# Patient Record
Sex: Female | Born: 1990 | Race: Black or African American | Hispanic: No | Marital: Single | Smoking: Never smoker
Health system: Southern US, Community
[De-identification: ages and names within clinical notes are randomized; demographics above are authoritative.]

## PROBLEM LIST (undated history)

## (undated) DIAGNOSIS — D649 Anemia, unspecified: Secondary | ICD-10-CM

## (undated) DIAGNOSIS — J45909 Unspecified asthma, uncomplicated: Secondary | ICD-10-CM

## (undated) DIAGNOSIS — G43909 Migraine, unspecified, not intractable, without status migrainosus: Secondary | ICD-10-CM

## (undated) HISTORY — DX: Migraine, unspecified, not intractable, without status migrainosus: G43.909

## (undated) HISTORY — PX: CHOLECYSTECTOMY: SHX55

## (undated) HISTORY — PX: HERNIA REPAIR: SHX51

---
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
## 2010-04-06 ENCOUNTER — Emergency Department (HOSPITAL_COMMUNITY): Admission: EM | Admit: 2010-04-06 | Discharge: 2009-07-13 | Payer: Self-pay | Admitting: Emergency Medicine

## 2010-09-12 NOTE — H&P (Signed)
Miranda Norton, Miranda Norton               ACCOUNT NO.:  [id number]   MEDICAL RECORD NO.:  [id number]          PATIENT TYPE:  AMB   LOCATION:  DAY                           FACILITY:  [facility]   PHYSICIAN:  Dalia Heading, M.D.  DATE OF BIRTH:  1991/02/08   DATE OF ADMISSION:  DATE OF DISCHARGE:  LH                              HISTORY & PHYSICAL   CHIEF COMPLAINT:  Acute cholecystitis.   HISTORY OF PRESENT ILLNESS:  Patient is a 20 year old black female, was  referred for evaluation and treatment biliary cholic secondary to acute  cholecystitis.  She has been having intermittent right upper quadrant  abdominal pain with radiation to the flank, nausea and indigestion for  the past few weeks.  It is made worse with fatty foods.  No fever,  chills, or jaundice have been noted.   PAST MEDICAL HISTORY:  Unremarkable.   PAST SURGICAL HISTORY:  Umbilical herniorrhaphy.   CURRENT MEDICATIONS:  Vitamin D.   ALLERGIES:  NO KNOWN DRUG ALLERGIES.   REVIEW OF SYSTEMS:  Noncontributory.   PHYSICAL EXAMINATION:  Patient is a well-developed, well-nourished black  female in no acute distress.  HEENT:  Reveals no scleral icterus.  LUNGS:  Clear to auscultation with equal breath sounds bilaterally.  HEART:  Reveals a regular rate and rhythm without S3, S4 or murmurs.  ABDOMEN:  Soft and nondistended.  She is tender in the right upper  quadrant to palpation.  No hepatosplenomegaly, masses or hernias  identified.   CT scan of the abdomen reveals a thickened gallbladder wall consistent  with acute cholecystitis.  Her common bile duct is within normal limits.   IMPRESSION:  Acute cholecystectomy.   PLAN:  The patient is scheduled for laparoscopic cholecystectomy on  April 21, 2008.  The risks and benefits of the procedure including  bleeding, infection, hepatobiliary injury, the possibility of an open  procedure were fully explained to the patient's mother, who gave  informed consent for the patient  as the patient is a minor.      Dalia Heading, M.D.  Electronically Signed     MAJ/MEDQ  D:  04/20/2008  T:  04/20/2008  Job:  [id number]   cc:   Short Stay at [facility]

## 2010-09-12 NOTE — Op Note (Signed)
[name], Miranda Norton               ACCOUNT NO.:  [id number]   MEDICAL RECORD NO.:  [id number]          PATIENT TYPE:  AMB   LOCATION:  DAY                           FACILITY:  [facility]   PHYSICIAN:  Dalia Heading, M.D.  DATE OF BIRTH:  January 16, 1991   DATE OF PROCEDURE:  04/21/2008  DATE OF DISCHARGE:                               OPERATIVE REPORT   PREOPERATIVE DIAGNOSIS:  Acute cholecystitis.   POSTOPERATIVE DIAGNOSIS:  Acute cholecystitis.   PROCEDURE:  Laparoscopic cholecystectomy.   SURGEON:  Dalia Heading, MD   ANESTHESIA:  General endotracheal.   INDICATIONS:  The patient is a 20 year old black female who presents  with acute cholecystitis.  This was diagnosed by CT scan of the abdomen.  The risks and benefits of the procedure including bleeding, infection,  hepatobiliary injury, the possibly of an open procedure were fully  explained to the patient's mother, who gave informed consent for the  patient as the patient is a minor.   PROCEDURE NOTE:  The patient was placed in the supine position.  After  induction of general endotracheal anesthesia, the abdomen was prepped  and draped using the usual sterile technique with DuraPrep.  Surgical  site confirmation was performed.   A supraumbilical incision was made down to the fascia.  A Veress needle  was introduced into the abdominal cavity and confirmation of placement  was done using the saline drop test.  The abdomen was then insufflated  to 16 mmHg pressure.  An 11-mm trocar was introduced into the abdominal  cavity under direct visualization without difficulty.  The patient was  placed in reverse Trendelenburg position.  An additional 11-mm trocar  was placed in the epigastric region, 5-mm trocars were placed in the  right upper quadrant right flank regions.  Liver was inspected and noted  to be within normal limits.  The gallbladder was retracted superior and  laterally.  The dissection was begun around the infundibulum  of the  gallbladder.  The cystic duct was first identified.  The juncture to the  infundibulum were identified.  Endoclips were placed proximally and  distally on the cystic duct and the cystic duct was divided.  This  likewise done on the cystic artery.  The gallbladder was then freed away  from the gallbladder fossa using Bovie electrocautery.  The gallbladder  was delivered through the epigastric trocar site using EndoCatch bag.  The gallbladder fossa was inspected.  No abnormal bleeding or bile  leakage was noted.  Surgicel was placed in the gallbladder fossa.  All  fluid and air was then evacuated from the abdominal cavity prior to  removal of the trocars.   All wounds were irrigated with normal saline.  All wounds were checked  with 0.5% Sensorcaine.  The supraumbilical fascia was reapproximated  using an 0 Vicryl interrupted suture.  All skin incisions were closed  using staples.  Betadine ointment and dry sterile dressings were  applied.   All tape and needle counts were correct at the end of the procedure.  The patient was extubated in the operating room and went  back to  recovery room awake in stable condition.   COMPLICATIONS:  None.   SPECIMEN:  Gallbladder.   ESTIMATED BLOOD LOSS:  Minimal.      Dalia Heading, M.D.  Electronically Signed     MAJ/MEDQ  D:  04/21/2008  T:  04/21/2008  Job:  [id number]   cc:   Francoise Schaumann. Milford Cage DO, FAAP  Fax: [phone]

## 2010-10-22 ENCOUNTER — Emergency Department (HOSPITAL_COMMUNITY)
Admission: EM | Admit: 2010-10-22 | Discharge: 2010-10-22 | Disposition: A | Discharge status: Home / Self Care | Payer: BC Managed Care – PPO | Attending: Emergency Medicine | Admitting: Emergency Medicine

## 2010-10-22 DIAGNOSIS — Z9109 Other allergy status, other than to drugs and biological substances: Secondary | ICD-10-CM | POA: Insufficient documentation

## 2010-10-22 DIAGNOSIS — F1010 Alcohol abuse, uncomplicated: Secondary | ICD-10-CM | POA: Insufficient documentation

## 2010-10-22 LAB — ETHANOL: Alcohol, Ethyl (B): 237 mg/dL — ABNORMAL HIGH (ref 0–11)

## 2011-02-01 LAB — BASIC METABOLIC PANEL
BUN: 11 mg/dL (ref 6–23)
Calcium: 9.4 mg/dL (ref 8.4–10.5)
Creatinine, Ser: 0.74 mg/dL (ref 0.4–1.2)
Glucose, Bld: 112 mg/dL — ABNORMAL HIGH (ref 70–99)

## 2011-02-01 LAB — URINALYSIS, ROUTINE W REFLEX MICROSCOPIC
Bilirubin Urine: NEGATIVE
Glucose, UA: NEGATIVE mg/dL
Hgb urine dipstick: NEGATIVE
Ketones, ur: 15 mg/dL — AB
Leukocytes, UA: NEGATIVE
Nitrite: NEGATIVE
Protein, ur: 100 mg/dL — AB
Specific Gravity, Urine: >1.03 — ABNORMAL HIGH (ref 1.005–1.030)
Urobilinogen, UA: 0.2 mg/dL (ref 0.0–1.0)
pH: 5.5 (ref 5.0–8.0)

## 2011-02-01 LAB — CBC
Hemoglobin: 9.6 g/dL — ABNORMAL LOW (ref 12.0–16.0)
MCHC: 32 g/dL (ref 31.0–37.0)
MCV: 79.4 fL (ref 78.0–98.0)
RBC: 3.77 MIL/uL — ABNORMAL LOW (ref 3.80–5.70)
RDW: 14.4 % (ref 11.4–15.5)

## 2011-02-01 LAB — HEPATIC FUNCTION PANEL
Bilirubin, Direct: <0.1 mg/dL (ref 0.0–0.3)
Total Protein: 7.2 g/dL (ref 6.0–8.3)

## 2011-02-01 LAB — DIFFERENTIAL
Basophils Absolute: 0 10*3/uL (ref 0.0–0.1)
Basophils Relative: 0 % (ref 0–1)
Eosinophils Absolute: 0 10*3/uL (ref 0.0–1.2)
Monocytes Absolute: 0.5 10*3/uL (ref 0.2–1.2)
Monocytes Relative: 8 % (ref 3–11)

## 2011-02-01 LAB — PREGNANCY, URINE: Preg Test, Ur: NEGATIVE

## 2011-02-01 LAB — URINE MICROSCOPIC-ADD ON

## 2011-02-15 LAB — URINALYSIS, ROUTINE W REFLEX MICROSCOPIC
Bilirubin Urine: NEGATIVE
Glucose, UA: NEGATIVE
Ketones, ur: NEGATIVE
Leukocytes, UA: NEGATIVE
Nitrite: NEGATIVE
Specific Gravity, Urine: >1.03 — ABNORMAL HIGH
Urobilinogen, UA: 0.2
pH: 6

## 2011-02-15 LAB — PREGNANCY, URINE: Preg Test, Ur: NEGATIVE

## 2011-02-15 LAB — URINE MICROSCOPIC-ADD ON

## 2011-11-13 ENCOUNTER — Encounter (HOSPITAL_COMMUNITY): Payer: Self-pay

## 2011-11-13 ENCOUNTER — Emergency Department (HOSPITAL_COMMUNITY): Payer: BC Managed Care – PPO

## 2011-11-13 ENCOUNTER — Emergency Department (HOSPITAL_COMMUNITY)
Admission: EM | Admit: 2011-11-13 | Discharge: 2011-11-13 | Disposition: A | Discharge status: Home / Self Care | Payer: BC Managed Care – PPO | Attending: Emergency Medicine | Admitting: Emergency Medicine

## 2011-11-13 DIAGNOSIS — R000 Tachycardia, unspecified: Secondary | ICD-10-CM | POA: Insufficient documentation

## 2011-11-13 DIAGNOSIS — J45909 Unspecified asthma, uncomplicated: Secondary | ICD-10-CM | POA: Insufficient documentation

## 2011-11-13 DIAGNOSIS — J40 Bronchitis, not specified as acute or chronic: Secondary | ICD-10-CM

## 2011-11-13 DIAGNOSIS — Z886 Allergy status to analgesic agent status: Secondary | ICD-10-CM | POA: Insufficient documentation

## 2011-11-13 HISTORY — DX: Unspecified asthma, uncomplicated: J45.909

## 2011-11-13 MED ORDER — GUAIFENESIN-CODEINE 100-10 MG/5ML PO SYRP: ORAL_SOLUTION | ORAL | Status: DC

## 2011-11-13 MED ORDER — AZITHROMYCIN 250 MG PO TABS: ORAL_TABLET | ORAL | Status: DC

## 2011-11-13 NOTE — ED Notes (Signed)
Pt POCT pregnancy test came out negative RN is aware.

## 2011-11-13 NOTE — ED Notes (Signed)
Pt c/o cough, congestion, runny nose and chest pain with coughing for 3 days. Pt alert and oriented x 3. Skin warm and dry. Color pink. No acute distress.

## 2011-11-13 NOTE — ED Provider Notes (Signed)
History     CSN: [id number]  Arrival date & time 11/13/11  1507   First MD Initiated Contact with Patient 11/13/11 1603      Chief Complaint  Patient presents with  . Cough    (Consider location/radiation/quality/duration/timing/severity/associated sxs/prior treatment) Patient is a 21 y.o. female presenting with cough. The history is provided by the patient and medical records. No language interpreter was used.  Cough This is a new problem. The current episode started 2 days ago. The problem occurs hourly. The problem has not changed since onset.The cough is productive of sputum. Maximum temperature: Had subjective feeling of fever last night. Associated symptoms include chest pain. Pertinent negatives include no wheezing. She has tried nothing for the symptoms. She is not a smoker. Her past medical history is significant for asthma.    Past Medical History  Diagnosis Date  . Asthma     Past Surgical History  Procedure Date  . Hernia repair   . Cholecystectomy     No family history on file.  History  Substance Use Topics  . Smoking status: Never Smoker   . Smokeless tobacco: Not on file  . Alcohol Use: No    OB History    Grav Para Term Preterm Abortions TAB SAB Ect Mult Living                  Review of Systems  Constitutional: Positive for fever.  HENT: Negative.   Eyes: Negative.   Respiratory: Positive for cough. Negative for wheezing.   Cardiovascular: Positive for chest pain.  Gastrointestinal: Negative.   Genitourinary: Negative.   Musculoskeletal: Negative.   Skin: Negative.   Neurological: Negative.   Psychiatric/Behavioral: Negative.     Allergies  Nsaids  Home Medications   Current Outpatient Rx  Name Route Sig Dispense Refill  . ALBUTEROL SULFATE HFA 108 (90 BASE) MCG/ACT IN AERS Inhalation Inhale 2 puffs into the lungs every 6 (six) hours as needed.    . ETONOGESTREL 68 MG [place] IMPL Subcutaneous Inject 1 each into the skin once.    Marland Kitchen  LORATADINE 10 MG PO TABS Oral Take 10 mg by mouth daily.      BP 115/78  Pulse 110  Temp 98.6 F (37 C) (Oral)  Resp 18  Ht 5\' 6"  (1.676 m)  Wt 165 lb (74.844 kg)  BMI 26.63 kg/m2  SpO2 100%  LMP 10/30/2011  Physical Exam  Nursing note and vitals reviewed. Constitutional: She is oriented to person, place, and time. She appears well-developed and well-nourished. No distress.  HENT:  Head: Normocephalic and atraumatic.  Right Ear: External ear normal.  Left Ear: External ear normal.  Mouth/Throat: Oropharynx is clear and moist.  Eyes: Conjunctivae and EOM are normal. Pupils are equal, round, and reactive to light.  Neck: Normal range of motion. Neck supple.  Cardiovascular: Normal rate and normal heart sounds.        Tachycardia of 110.  Pulmonary/Chest: Effort normal and breath sounds normal.  Abdominal: Soft. Bowel sounds are normal.  Musculoskeletal: Normal range of motion.  Neurological: She is alert and oriented to person, place, and time.       No sensory or motor deficit.  Skin: Skin is warm and dry.  Psychiatric: She has a normal mood and affect. Her behavior is normal.    ED Course  Procedures (including critical care time)   Labs Reviewed  POCT PREGNANCY, URINE   Dg Chest 2 View  11/13/2011  *RADIOLOGY REPORT*  Clinical Data: .  Cough.Chest pain.  CHEST - 2 VIEW  Comparison: None.  Findings: No infiltrate, congestive heart failure or pneumothorax. Heart size within normal limits.  IMPRESSION: No acute abnormality.  Original Report Authenticated By: Fuller Canada, M.D.   Rx for bronchitis with Z-pak, Robitussin AC.   1. Bronchitis          Carleene Cooper III, MD 11/13/11 1622

## 2011-11-13 NOTE — ED Notes (Signed)
Pt reports for past 2 days has had a cough and chest hurts to breathe.  Chest pain gets worse when coughs.  Reports cough has been productive at times but hasn't looked at what she was coughing up.  Unsure if has had fever.

## 2011-11-15 ENCOUNTER — Emergency Department (HOSPITAL_COMMUNITY)
Admission: EM | Admit: 2011-11-15 | Discharge: 2011-11-15 | Disposition: A | Discharge status: Home / Self Care | Payer: BC Managed Care – PPO | Attending: Emergency Medicine | Admitting: Emergency Medicine

## 2011-11-15 ENCOUNTER — Encounter (HOSPITAL_COMMUNITY): Payer: Self-pay | Admitting: *Deleted

## 2011-11-15 DIAGNOSIS — J45909 Unspecified asthma, uncomplicated: Secondary | ICD-10-CM | POA: Insufficient documentation

## 2011-11-15 DIAGNOSIS — R21 Rash and other nonspecific skin eruption: Secondary | ICD-10-CM | POA: Insufficient documentation

## 2011-11-15 DIAGNOSIS — T7840XA Allergy, unspecified, initial encounter: Secondary | ICD-10-CM

## 2011-11-15 DIAGNOSIS — Z9089 Acquired absence of other organs: Secondary | ICD-10-CM | POA: Insufficient documentation

## 2011-11-15 DIAGNOSIS — T40605A Adverse effect of unspecified narcotics, initial encounter: Secondary | ICD-10-CM | POA: Insufficient documentation

## 2011-11-15 MED ORDER — PREDNISONE 20 MG PO TABS
40.0000 mg | ORAL_TABLET | Freq: Once | ORAL | Status: AC
  Administered 2011-11-15: 40 mg via ORAL
  Filled 2011-11-15: qty 2

## 2011-11-15 MED ORDER — PREDNISONE 20 MG PO TABS: ORAL_TABLET | ORAL | Status: DC

## 2011-11-15 MED ORDER — DIPHENHYDRAMINE HCL 25 MG PO CAPS
25.0000 mg | ORAL_CAPSULE | Freq: Once | ORAL | Status: AC
  Administered 2011-11-15: 25 mg via ORAL
  Filled 2011-11-15: qty 1

## 2011-11-15 NOTE — ED Provider Notes (Signed)
History     CSN: [id number]  Arrival date & time 11/15/11  1913   First MD Initiated Contact with Patient 11/15/11 2003      Chief Complaint  Patient presents with  . Rash    (Consider location/radiation/quality/duration/timing/severity/associated sxs/prior treatment) HPI Comments: Patient c/o red, mildly itching rash to her face, neck and trunk that began after taking codeine cough syrup for the first time.  States she is also taking a z-pack for bronchitis, but states she has taken zithromax before without complications.  She denies swelling, difficulty breathing or swallowing.    Patient is a 21 y.o. female presenting with rash. The history is provided by the patient.  Rash  This is a new problem. The current episode started yesterday. The problem has not changed since onset.The problem is associated with new medications. There has been no fever. The rash is present on the face, neck and trunk. The patient is experiencing no pain. The pain has been constant since onset. Associated symptoms include itching. Pertinent negatives include no blisters, no pain and no weeping. She has tried antihistamines for the symptoms. The treatment provided mild relief. Risk factors include new medications.    Past Medical History  Diagnosis Date  . Asthma     Past Surgical History  Procedure Date  . Hernia repair   . Cholecystectomy     History reviewed. No pertinent family history.  History  Substance Use Topics  . Smoking status: Never Smoker   . Smokeless tobacco: Not on file  . Alcohol Use: No    OB History    Grav Para Term Preterm Abortions TAB SAB Ect Mult Living                  Review of Systems  Constitutional: Negative for fever, activity change and appetite change.  HENT: Positive for congestion. Negative for sore throat, facial swelling, trouble swallowing, neck pain and neck stiffness.   Respiratory: Positive for cough. Negative for shortness of breath, wheezing and  stridor.   Cardiovascular: Negative for chest pain.  Gastrointestinal: Negative for nausea, vomiting and abdominal distention.  Musculoskeletal: Negative for back pain, joint swelling, arthralgias and gait problem.  Skin: Positive for color change, itching and rash. Negative for wound.  Neurological: Negative for dizziness, weakness, numbness and headaches.  Hematological: Negative for adenopathy.  All other systems reviewed and are negative.    Allergies  Nsaids  Home Medications   Current Outpatient Rx  Name Route Sig Dispense Refill  . ALBUTEROL SULFATE HFA 108 (90 BASE) MCG/ACT IN AERS Inhalation Inhale 2 puffs into the lungs every 6 (six) hours as needed.    . AZITHROMYCIN 250 MG PO TABS  Take 2 tablets today, then 1 tablet per day for the next 4 days. 6 tablet 0  . ETONOGESTREL 68 MG [place] IMPL Subcutaneous Inject 1 each into the skin once.    . GUAIFENESIN-CODEINE 100-10 MG/5ML PO SYRP  Take 2 teaspoons every 4 hours if needed for cough. 120 mL 0  . LORATADINE 10 MG PO TABS Oral Take 10 mg by mouth daily.      BP 120/64  Pulse 91  Temp 98.8 F (37.1 C) (Oral)  Resp 20  Ht 5\' 6"  (1.676 m)  Wt 165 lb (74.844 kg)  BMI 26.63 kg/m2  SpO2 100%  LMP 10/30/2011  Physical Exam  Nursing note and vitals reviewed. Constitutional: She is oriented to person, place, and time. She appears well-developed and well-nourished. No distress.  HENT:  Head: Normocephalic and atraumatic.  Mouth/Throat: Oropharynx is clear and moist.  Eyes: Conjunctivae and EOM are normal. Pupils are equal, round, and reactive to light.  Neck: Normal range of motion. Neck supple.  Cardiovascular: Normal rate, regular rhythm, normal heart sounds and intact distal pulses.   No murmur heard. Pulmonary/Chest: Effort normal and breath sounds normal.  Abdominal: Soft. She exhibits no distension. There is no tenderness.  Musculoskeletal: She exhibits no edema.  Neurological: She is alert and oriented to person,  place, and time. She exhibits normal muscle tone. Coordination normal.  Skin: Skin is warm. Rash noted. There is erythema.       Fine, erythematous papular rash to the chest, face and neck and bilateral arms.  No edema.  No vesicles or welts.      ED Course  Procedures (including critical care time)  Labs Reviewed - No data to display      MDM     Discreet papular rash to the chest, face, neck and bilateral arms.  No edema or vesicles.  Patient taking codeine cough syrup, I have advised her to d/c.  Airway is patent.  No wheezes or stridor.    Patient has been observed, no further complications.  Appears stable for d/c.  Pt agrees to d/c the codeine cough syrup.  The patient appears reasonably screened and/or stabilized for discharge and I doubt any other medical condition or other [facility] requiring further screening, evaluation, or treatment in the ED at this time prior to discharge.   Prescribed: presnisone   Labrian Torregrossa L. [place], [place] 11/21/11 2242

## 2011-11-15 NOTE — ED Notes (Addendum)
Rash to face and neck, taking meds for bronchitis.  Took 50 mg benadryl 10 am.

## 2011-11-23 NOTE — ED Provider Notes (Signed)
Medical screening examination/treatment/procedure(s) were performed by non-physician practitioner and as supervising physician I was immediately available for consultation/collaboration.  Hodges Treiber, MD 11/23/11 0730 

## 2012-05-02 ENCOUNTER — Emergency Department (HOSPITAL_COMMUNITY)
Admission: EM | Admit: 2012-05-02 | Discharge: 2012-05-02 | Disposition: A | Discharge status: Home / Self Care | Payer: BC Managed Care – PPO | Attending: Emergency Medicine | Admitting: Emergency Medicine

## 2012-05-02 ENCOUNTER — Emergency Department (HOSPITAL_COMMUNITY): Payer: BC Managed Care – PPO

## 2012-05-02 ENCOUNTER — Encounter (HOSPITAL_COMMUNITY): Payer: Self-pay | Admitting: *Deleted

## 2012-05-02 DIAGNOSIS — J45909 Unspecified asthma, uncomplicated: Secondary | ICD-10-CM | POA: Insufficient documentation

## 2012-05-02 DIAGNOSIS — Z79899 Other long term (current) drug therapy: Secondary | ICD-10-CM | POA: Insufficient documentation

## 2012-05-02 DIAGNOSIS — Z862 Personal history of diseases of the blood and blood-forming organs and certain disorders involving the immune mechanism: Secondary | ICD-10-CM | POA: Insufficient documentation

## 2012-05-02 DIAGNOSIS — R0789 Other chest pain: Secondary | ICD-10-CM

## 2012-05-02 DIAGNOSIS — R071 Chest pain on breathing: Secondary | ICD-10-CM | POA: Insufficient documentation

## 2012-05-02 HISTORY — DX: Anemia, unspecified: D64.9

## 2012-05-02 LAB — D-DIMER, QUANTITATIVE: D-Dimer, Quant: <0.27 ug/mL-FEU (ref 0.00–0.48)

## 2012-05-02 MED ORDER — ALBUTEROL SULFATE (5 MG/ML) 0.5% IN NEBU
5.0000 mg | INHALATION_SOLUTION | Freq: Once | RESPIRATORY_TRACT | Status: AC
  Administered 2012-05-02: 5 mg via RESPIRATORY_TRACT
  Filled 2012-05-02: qty 1

## 2012-05-02 MED ORDER — IPRATROPIUM BROMIDE 0.02 % IN SOLN
0.5000 mg | Freq: Once | RESPIRATORY_TRACT | Status: AC
  Administered 2012-05-02: 0.5 mg via RESPIRATORY_TRACT
  Filled 2012-05-02: qty 2.5

## 2012-05-02 MED ORDER — HYDROCODONE-ACETAMINOPHEN 5-325 MG PO TABS: 1.0000 | ORAL_TABLET | ORAL | Status: AC | PRN

## 2012-05-02 MED ORDER — PREDNISONE 50 MG PO TABS: 50.0000 mg | ORAL_TABLET | Freq: Every day | ORAL | Status: DC

## 2012-05-02 MED ORDER — PREDNISONE 50 MG PO TABS
60.0000 mg | ORAL_TABLET | Freq: Once | ORAL | Status: AC
  Administered 2012-05-02: 60 mg via ORAL
  Filled 2012-05-02: qty 1

## 2012-05-02 NOTE — ED Notes (Signed)
Pt complaining of chest heaviness with difficulty breathing and body aches.

## 2012-05-02 NOTE — ED Provider Notes (Signed)
Medical screening examination/treatment/procedure(s) were performed by non-physician practitioner and as supervising physician I was immediately available for consultation/collaboration.   Glynn Octave, MD 05/02/12 2003

## 2012-05-02 NOTE — ED Provider Notes (Signed)
History     CSN: [id number]  Arrival date & time 05/02/12  1450   First MD Initiated Contact with Patient 05/02/12 1619      Chief Complaint  Patient presents with  . Influenza  . Asthma    (Consider location/radiation/quality/duration/timing/severity/associated sxs/prior treatment) HPI Comments: Halen A Conaty presents with chest discomfort described as pressure with has been constant since yesterday morning upon waking.  She does have a history of asthma, but denies wheezing since the discomfort began, nor has she been short of breath. Pain is worsened with palpation, deep inspiration and with movement such as twisting her body or raising her arms.  She denies fevers, chills, cough and has had no upper respiratory symptoms.  The history is provided by the patient.    Past Medical History  Diagnosis Date  . Asthma   . Anemia     Past Surgical History  Procedure Date  . Hernia repair   . Cholecystectomy     History reviewed. No pertinent family history.  History  Substance Use Topics  . Smoking status: Never Smoker   . Smokeless tobacco: Not on file  . Alcohol Use: No    OB History    Grav Para Term Preterm Abortions TAB SAB Ect Mult Living                  Review of Systems  Constitutional: Negative for fever.  HENT: Negative for congestion, sore throat and neck pain.   Eyes: Negative.   Respiratory: Positive for chest tightness. Negative for cough and shortness of breath.   Cardiovascular: Negative for chest pain.  Gastrointestinal: Negative for nausea and abdominal pain.  Genitourinary: Negative.   Musculoskeletal: Negative for joint swelling and arthralgias.  Skin: Negative.  Negative for rash and wound.  Neurological: Negative for dizziness, weakness, light-headedness, numbness and headaches.  Hematological: Negative.   Psychiatric/Behavioral: Negative.     Allergies  Nsaids  Home Medications   Current Outpatient Rx  Name  Route  Sig  Dispense   Refill  . ACETAMINOPHEN 500 MG PO TABS   Oral   Take 1,000 mg by mouth once as needed. For headache pain         . ALBUTEROL SULFATE HFA 108 (90 BASE) MCG/ACT IN AERS   Inhalation   Inhale 2 puffs into the lungs every 6 (six) hours as needed. For shortness of breath         . LORATADINE 10 MG PO TABS   Oral   Take 10 mg by mouth daily.         . ETONOGESTREL 68 MG [place] IMPL   Subcutaneous   Inject 1 each into the skin once.           BP 117/78  Pulse 70  Temp 98.5 F (36.9 C) (Oral)  Resp 18  Wt 158 lb (71.668 kg)  SpO2 100%  LMP 05/02/2012  Physical Exam  Nursing note and vitals reviewed. Constitutional: She appears well-developed and well-nourished.  HENT:  Head: Normocephalic and atraumatic.  Eyes: Conjunctivae normal are normal.  Neck: Normal range of motion.  Cardiovascular: Normal rate, regular rhythm, normal heart sounds and intact distal pulses.   Pulmonary/Chest: Effort normal and breath sounds normal. No respiratory distress. She has no wheezes. She has no rales. She exhibits tenderness.  Abdominal: Soft. Bowel sounds are normal. There is no tenderness.  Musculoskeletal: Normal range of motion.  Neurological: She is alert.  Skin: Skin is warm and dry.  Psychiatric: She has a normal mood and affect.    ED Course  Procedures (including critical care time)   Labs Reviewed  D-DIMER, QUANTITATIVE   Dg Chest 2 View  05/02/2012  *RADIOLOGY REPORT*  Clinical Data: Chest pain  CHEST - 2 VIEW  Comparison: 11/13/2011  Findings: Cardiomediastinal silhouette is within normal limits. The lungs are clear. No pleural effusion.  No pneumothorax.  No acute osseous abnormality. Cholecystectomy clips noted.  IMPRESSION: Normal chest.   Original Report Authenticated By: Christiana Pellant, M.D.      1. Chest wall pain     Patient was given albuterol/atrovent neb tx with some reported improvement in chest pressure,  No change in respiratory exam,  Still clear without  wheezing.  Prednisone 50 mg po given  MDM   Patients labs and/or radiological studies were reviewed during the medical decision making and disposition process. D dimer negative,  Doubt PE as source of symptoms.  Suspect chest wall pain given reproducible character of symptoms.  Pt advised f/u with pcp or return here for any worsened sx.       Burgess Amor, [place] 05/02/12 1928

## 2012-05-02 NOTE — ED Notes (Signed)
Pt c/o SOB since yesterday morning.  Denies any cough or fever.

## 2012-08-20 ENCOUNTER — Encounter: Payer: Self-pay | Admitting: Adult Health

## 2012-08-20 ENCOUNTER — Ambulatory Visit (INDEPENDENT_AMBULATORY_CARE_PROVIDER_SITE_OTHER): Payer: BC Managed Care – PPO | Admitting: Adult Health

## 2012-08-20 VITALS — BP 110/72 | Ht 66.0 in | Wt 175.0 lb

## 2012-08-20 DIAGNOSIS — G43909 Migraine, unspecified, not intractable, without status migrainosus: Secondary | ICD-10-CM | POA: Insufficient documentation

## 2012-08-20 DIAGNOSIS — Z3202 Encounter for pregnancy test, result negative: Secondary | ICD-10-CM

## 2012-08-20 DIAGNOSIS — J45909 Unspecified asthma, uncomplicated: Secondary | ICD-10-CM | POA: Insufficient documentation

## 2012-08-20 DIAGNOSIS — Z3009 Encounter for other general counseling and advice on contraception: Secondary | ICD-10-CM

## 2012-08-20 DIAGNOSIS — Z3046 Encounter for surveillance of implantable subdermal contraceptive: Secondary | ICD-10-CM

## 2012-08-20 DIAGNOSIS — IMO0001 Reserved for inherently not codable concepts without codable children: Secondary | ICD-10-CM

## 2012-08-20 HISTORY — DX: Migraine, unspecified, not intractable, without status migrainosus: G43.909

## 2012-08-20 MED ORDER — NORETHINDRONE 0.35 MG PO TABS: 1.0000 | ORAL_TABLET | Freq: Every day | ORAL | Status: DC

## 2012-08-20 NOTE — Patient Instructions (Addendum)
Start micronor today  Use condoms  Follow up in the 3 months Sign up for my chart Use condoms x 4 weeks, keep clean and dry x 24 hours, no heavy lifting, keep steri strips on x 72 hours, Keep pressure dressing on x 24 hours. Follow up prn problems.

## 2012-08-20 NOTE — Progress Notes (Signed)
Subjective:     Patient ID: Miranda Norton, female   DOB: Aug 11, 1990, 22 y.o.   MRN: [id number]  HPI Miranda Norton is a 22 year old black female in for implanon removal and get birth control pills.  Review of Systems Patient denies any  blurred vision, shortness of breath, chest pain, abdominal pain, problems with bowel movements, urination, or intercourse. She does have migraines and has vision changes at times, no numbness.No mood changes. Reviewed past medical,surgical, social and family history. Reviewed medications and allergies.     Objective:   Physical Exam Blood pressure 110/72, height 5\' 6"  (1.676 m), weight 175 lb (79.379 kg), last menstrual period 08/16/2012.Right arm cleansed with betadine and injected with 1.5 cc 2% lidocaine, under sterile technique, a #11 blade was used to make small vertical incision and the implanon was removed. Steri strips were applied. Pressure dressing was applied   urine HCG was negative. Assessment:      Implanon removal  Contraceptive management     Plan:      Start micronor today   Use condoms x 4 weeks, keep clean and dry x 24 hours, no heavy lifting, keep steri strips on x 72 hours, Keep pressure dressing on x 24 hours. Follow up prn problems. Return in 3 months to review pills

## 2012-08-25 ENCOUNTER — Telehealth: Payer: Self-pay | Admitting: Certified Registered Nurse Anesthetist

## 2012-08-25 NOTE — Telephone Encounter (Signed)
Spoke with pt. Got Implanon removed on Wednesday. Pt left bandage on x 72 hours. When she took the bandage off, she noticed the hole had not closed up. +sore, has a blistered section, +bleeding at the site. Advised would need to schedule an appt to be rechecked. Appt scheduled. Pt voiced understanding. Pt could not come in before Wednesday due to school. Advised if it gets better before then, feel free to call and cancel appt.

## 2012-08-27 ENCOUNTER — Encounter: Payer: Self-pay | Admitting: *Deleted

## 2012-08-28 ENCOUNTER — Ambulatory Visit: Payer: BC Managed Care – PPO | Admitting: Adult Health

## 2012-11-19 ENCOUNTER — Ambulatory Visit: Payer: BC Managed Care – PPO | Admitting: Adult Health

## 2013-03-28 IMAGING — CR DG CHEST 2V
2 series · 2 of 2 positions shown · non-contrast
Comparison: None.

CLINICAL DATA: .  Cough.Chest pain.

CHEST - 2 VIEW

[view not recorded (1 of 2)]
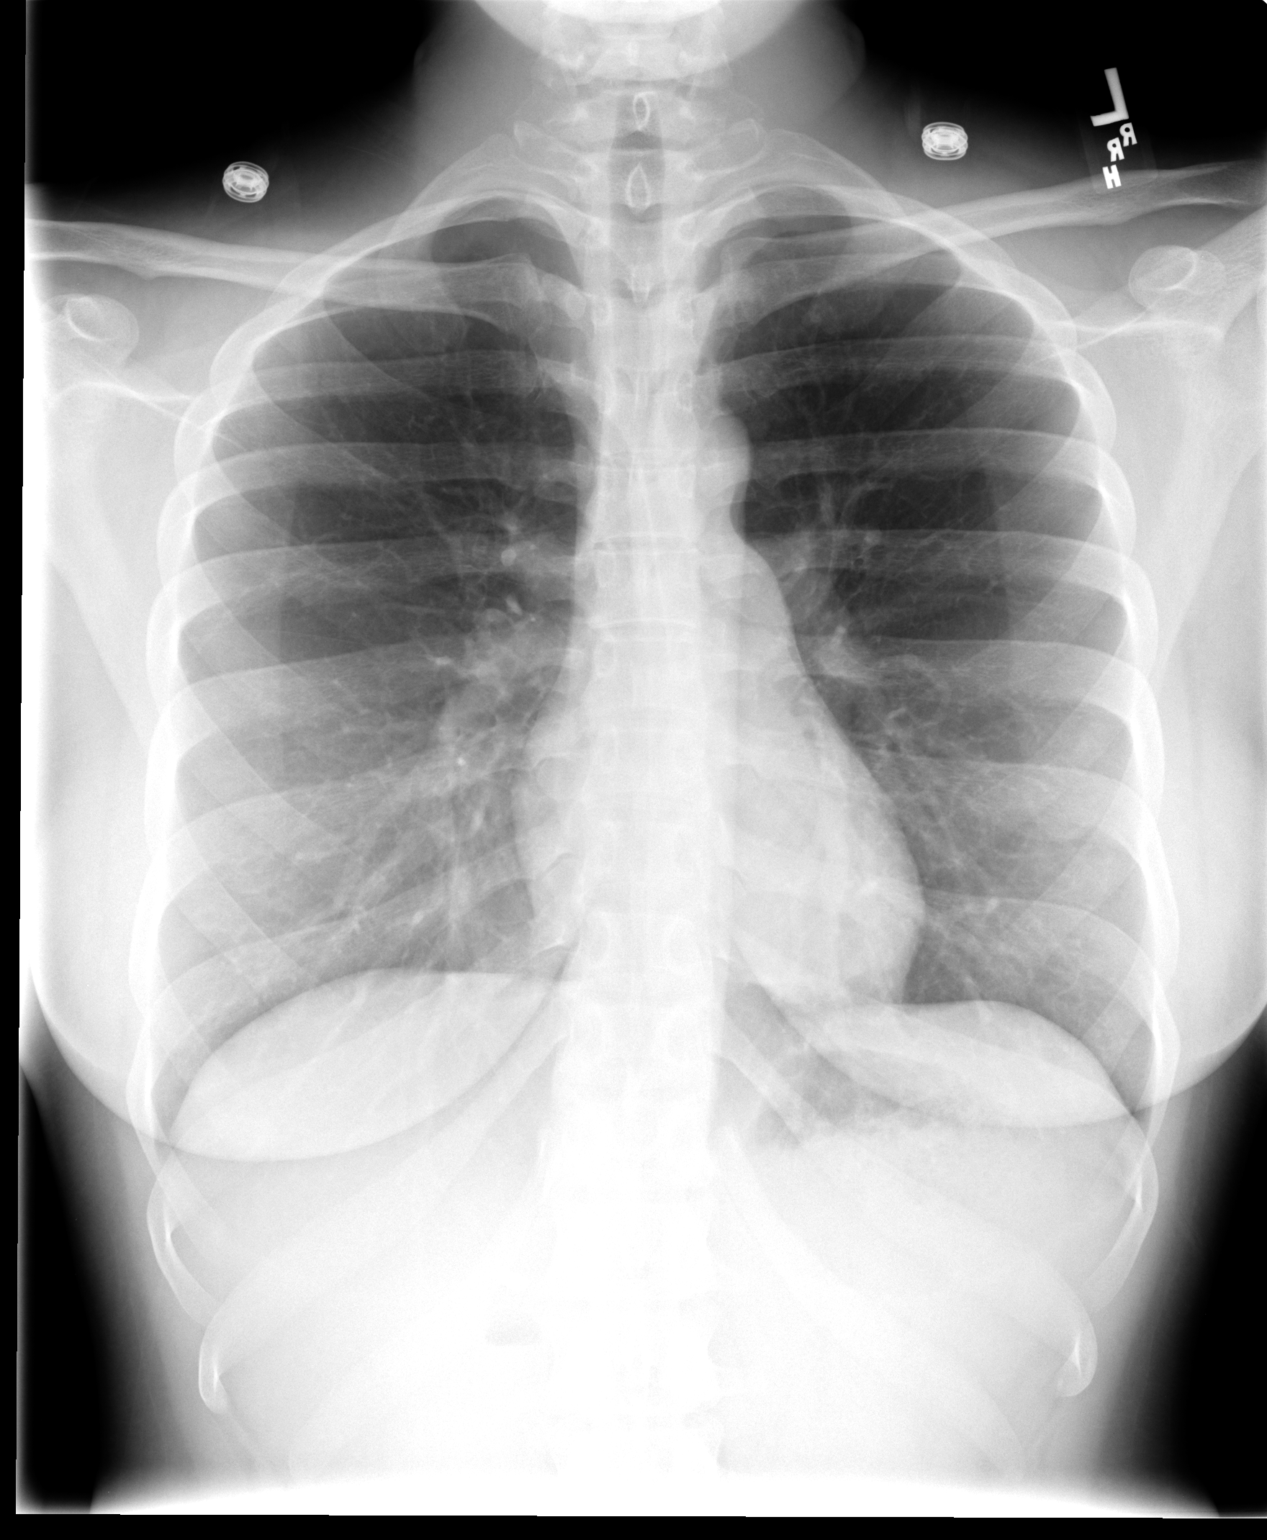

[view not recorded (2 of 2)]
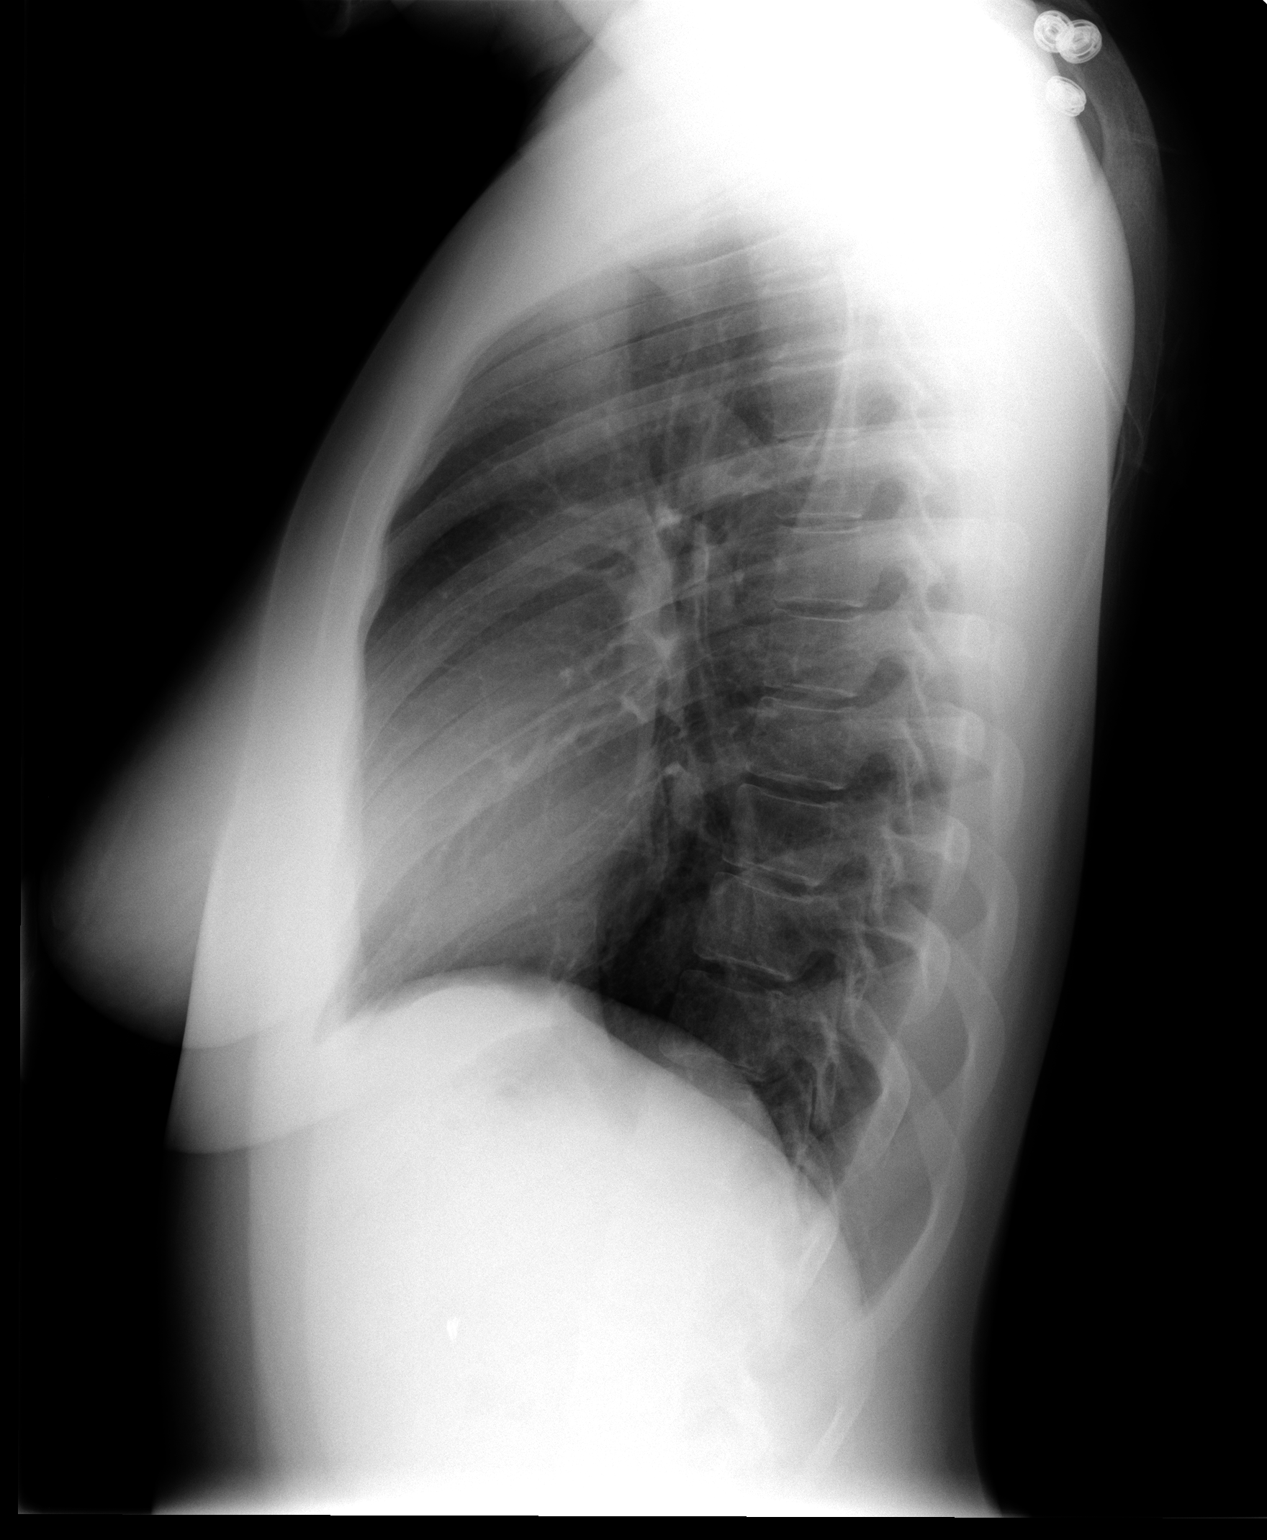

[2 of 2 positions shown; findings below may reference images not displayed]

FINDINGS: No infiltrate, congestive heart failure or pneumothorax..
Heart size within normal limits.
IMPRESSION: No acute abnormality.

## 2013-09-15 IMAGING — CR DG CHEST 2V
2 series · 2 of 2 positions shown · non-contrast
Comparison: 11/13/2011

CLINICAL DATA: Chest pain

CHEST - 2 VIEW

[view not recorded (1 of 2)]
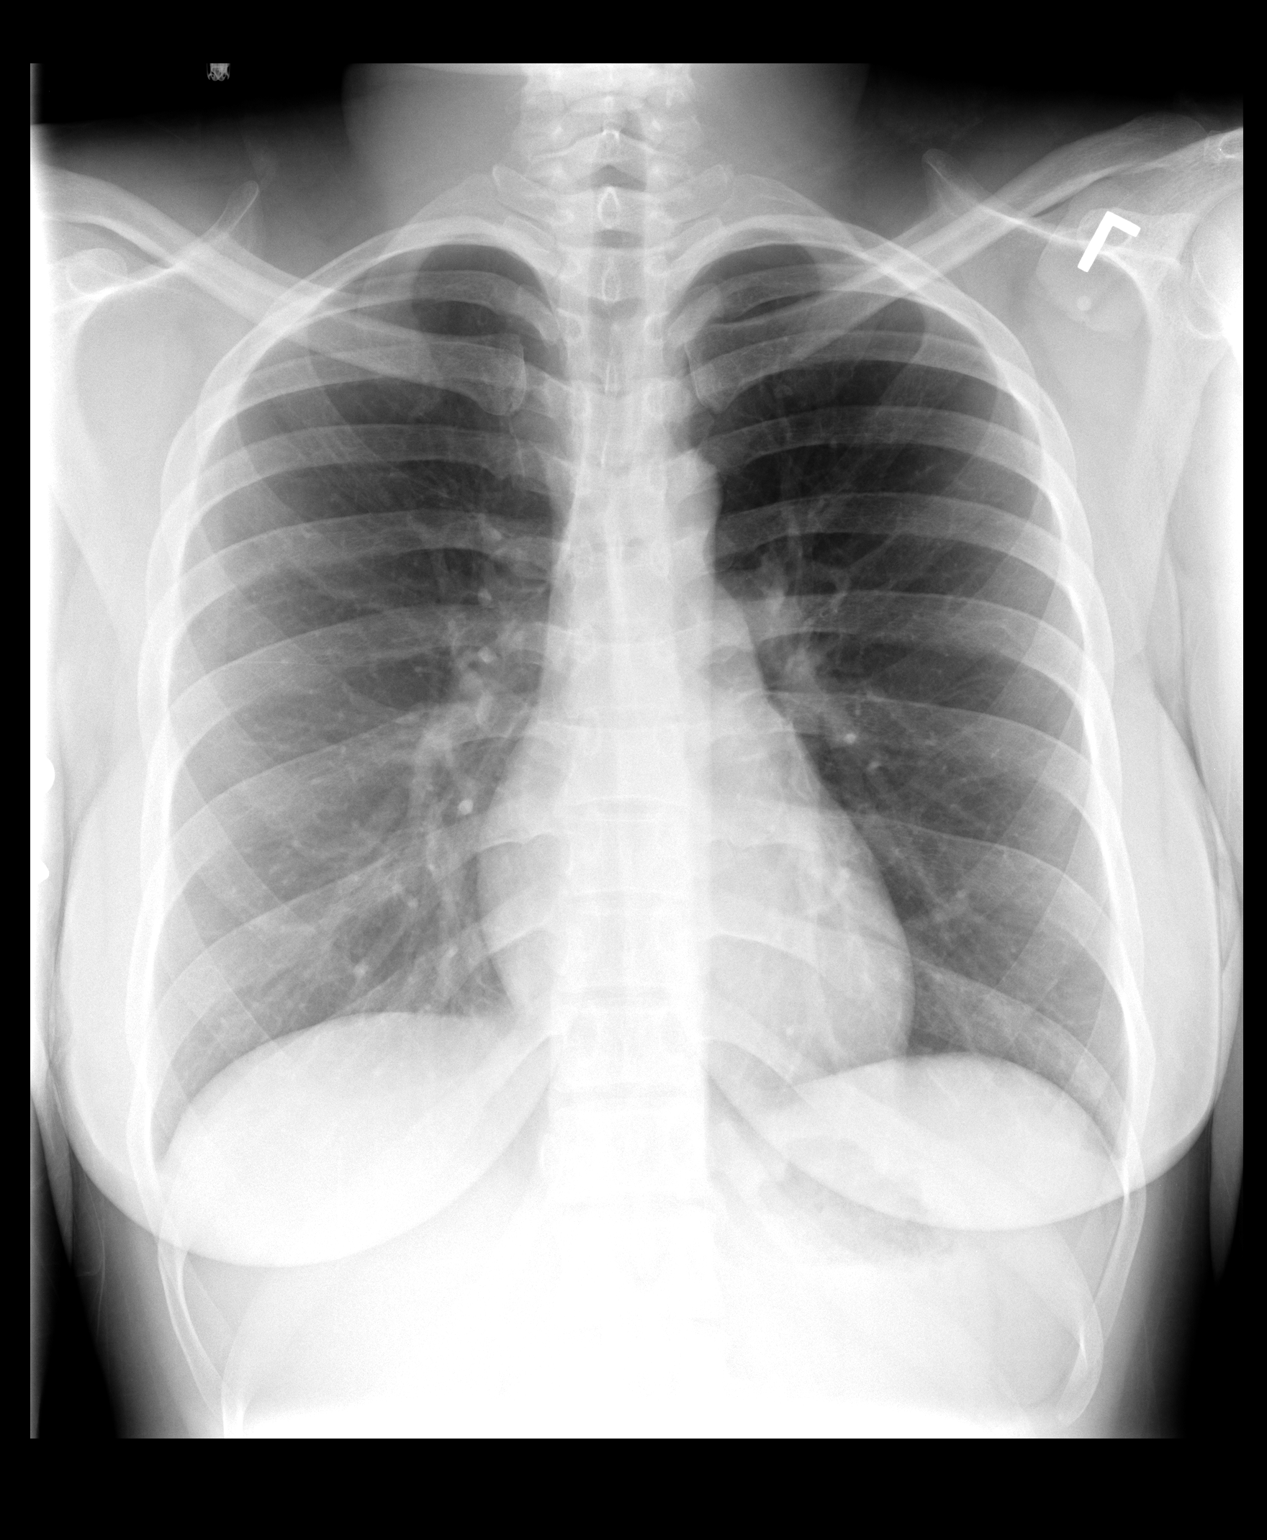

[view not recorded (2 of 2)]
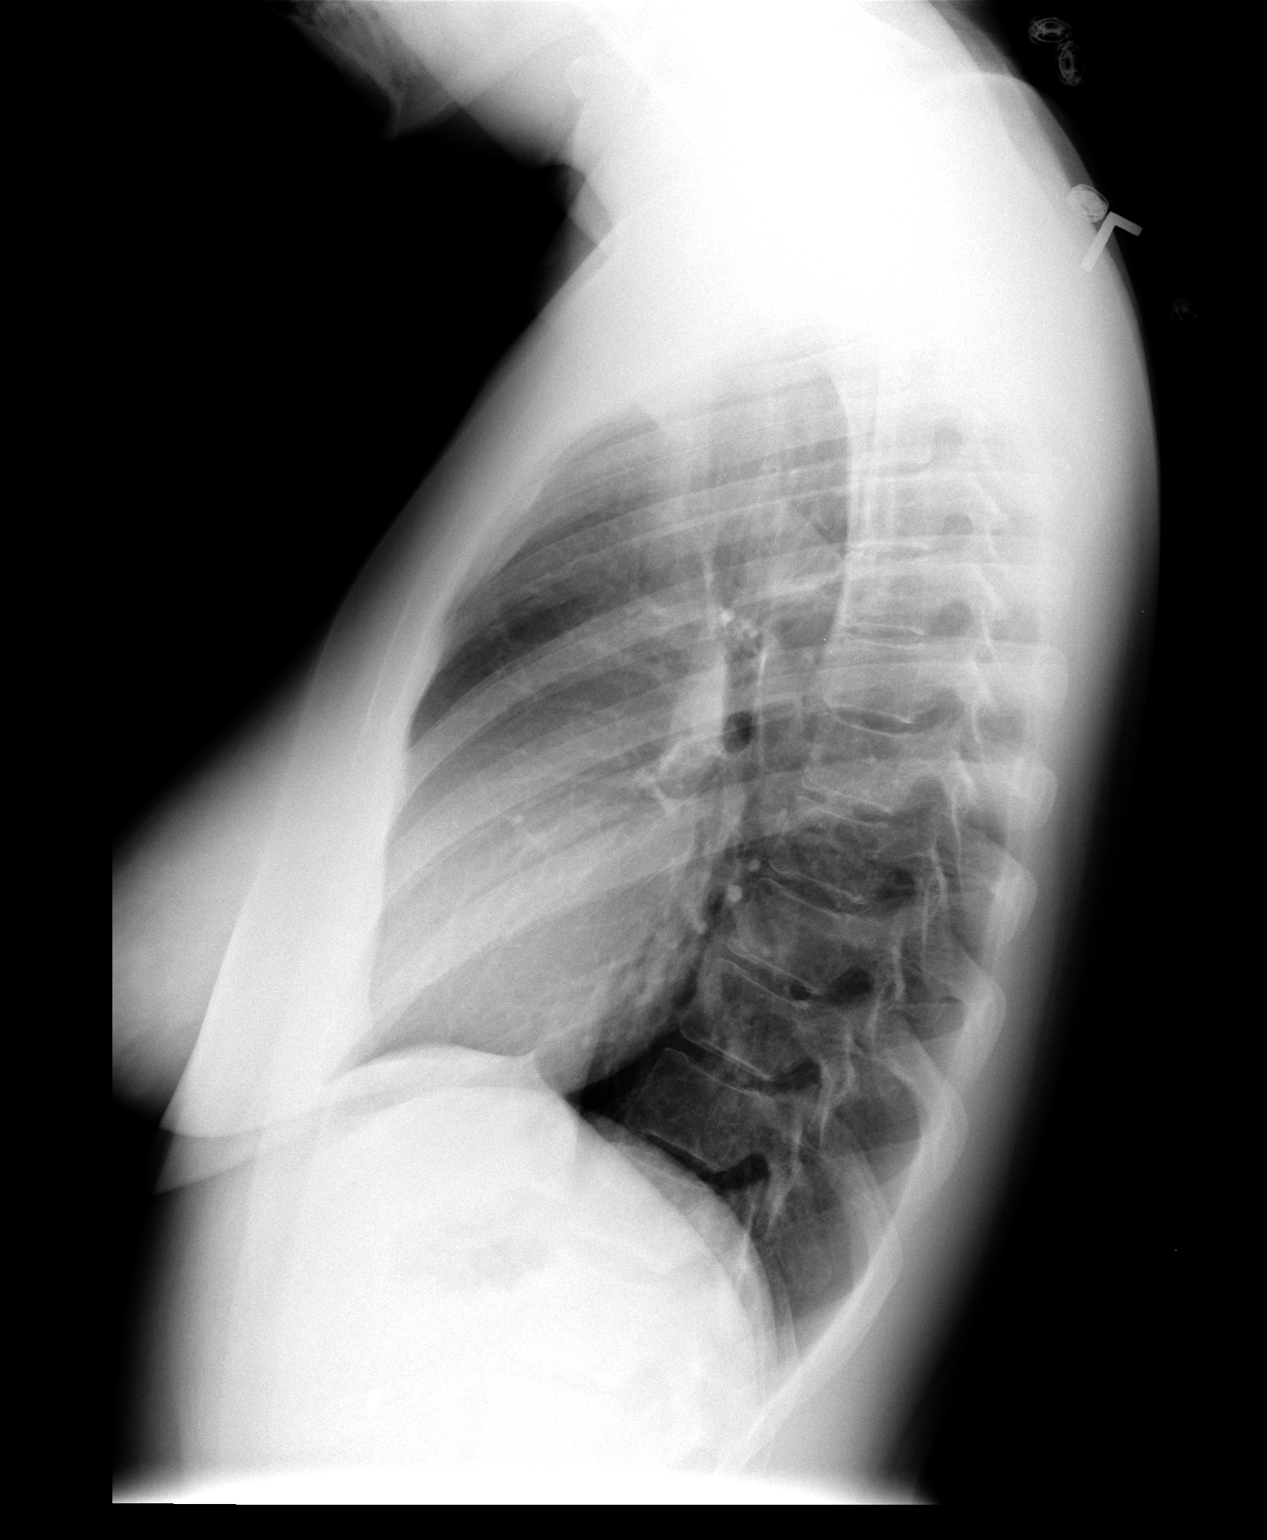

[2 of 2 positions shown; findings below may reference images not displayed]

FINDINGS: Cardiomediastinal silhouette is within normal limits. The
lungs are clear. No pleural effusion.  No pneumothorax.  No acute
osseous abnormality. Cholecystectomy clips noted.
IMPRESSION: Normal chest.

## 2015-04-25 ENCOUNTER — Emergency Department (INDEPENDENT_AMBULATORY_CARE_PROVIDER_SITE_OTHER)
Admission: EM | Admit: 2015-04-25 | Discharge: 2015-04-25 | Disposition: A | Discharge status: Home / Self Care | Payer: Managed Care, Other (non HMO) | Source: Home / Self Care | Attending: Emergency Medicine | Admitting: Emergency Medicine

## 2015-04-25 ENCOUNTER — Encounter (HOSPITAL_COMMUNITY): Payer: Self-pay | Admitting: Emergency Medicine

## 2015-04-25 DIAGNOSIS — J45901 Unspecified asthma with (acute) exacerbation: Secondary | ICD-10-CM

## 2015-04-25 DIAGNOSIS — J40 Bronchitis, not specified as acute or chronic: Secondary | ICD-10-CM

## 2015-04-25 MED ORDER — ALBUTEROL SULFATE HFA 108 (90 BASE) MCG/ACT IN AERS: 2.0000 | INHALATION_SPRAY | RESPIRATORY_TRACT | Status: AC | PRN

## 2015-04-25 MED ORDER — ALBUTEROL SULFATE (2.5 MG/3ML) 0.083% IN NEBU
2.5000 mg | INHALATION_SOLUTION | Freq: Once | RESPIRATORY_TRACT | Status: AC
  Administered 2015-04-25: 2.5 mg via RESPIRATORY_TRACT

## 2015-04-25 MED ORDER — ALBUTEROL SULFATE (2.5 MG/3ML) 0.083% IN NEBU
INHALATION_SOLUTION | RESPIRATORY_TRACT | Status: AC
  Filled 2015-04-25: qty 3

## 2015-04-25 MED ORDER — AMOXICILLIN 500 MG PO CAPS: 500.0000 mg | ORAL_CAPSULE | Freq: Two times a day (BID) | ORAL | Status: AC

## 2015-04-25 NOTE — ED Provider Notes (Signed)
CSN: [id number]     Arrival date & time 04/25/15  1332 History   First MD Initiated Contact with Patient 04/25/15 1557     Chief Complaint  Patient presents with  . Cough   (Consider location/radiation/quality/duration/timing/severity/associated sxs/prior Treatment) HPI  She is a 24 year old woman here for evaluation of cough. Of note, she is approximately [redacted] weeks pregnant.  She reports a cough for about 4 days. It has gradually been getting worse. She has also heard some intermittent wheezing. She has had some nasal congestion. She denies any shortness of breath. She does report nausea and some vomiting, but states she has been nauseated since finding out she is pregnant.  Denies fevers at home, but is febrile at 100.4 here.  She has been taking her daily Claritin as well as cough drops. She does have a history of asthma. She states she has an inhaler, but she does not currently have it as she is visiting relatives for the holidays.  Past Medical History  Diagnosis Date  . Asthma   . Anemia   . Migraines 08/20/2012   Past Surgical History  Procedure Laterality Date  . Hernia repair    . Cholecystectomy     Family History  Problem Relation Age of Onset  . Hypertension Maternal Grandmother   . Diabetes Mother   . Hypertension Mother   . Heart disease Other   . Cancer Other   . Mental illness Other    Social History  Substance Use Topics  . Smoking status: Never Smoker   . Smokeless tobacco: Never Used  . Alcohol Use: No   OB History    No data available     Review of Systems As in history of present illness Allergies  Nsaids  Home Medications   Prior to Admission medications   Medication Sig Start Date End Date Taking? Authorizing Provider  acetaminophen (TYLENOL) 500 MG tablet Take 1,000 mg by mouth once as needed. For headache pain    Historical Provider, MD  albuterol (PROVENTIL HFA;VENTOLIN HFA) 108 (90 BASE) MCG/ACT inhaler Inhale 2 puffs into the lungs every 4  (four) hours as needed for wheezing or shortness of breath (cough). For shortness of breath 04/25/15   Charm RingsErin J Hazely Sealey, MD  amoxicillin (AMOXIL) 500 MG capsule Take 1 capsule (500 mg total) by mouth 2 (two) times daily. 04/25/15   Charm RingsErin J Arnett Duddy, MD  loratadine (CLARITIN) 10 MG tablet Take 10 mg by mouth daily.    Historical Provider, MD   Meds Ordered and Administered this Visit   Medications  albuterol (PROVENTIL) (2.5 MG/3ML) 0.083% nebulizer solution 2.5 mg (2.5 mg Nebulization Given 04/25/15 1630)    BP 129/86 mmHg  Pulse 117  Temp(Src) 100.4 F (38 C) (Oral)  Resp 16  SpO2 96%  LMP 03/22/2015 No data found.   Physical Exam  Constitutional: She is oriented to person, place, and time. She appears well-developed and well-nourished. No distress.  HENT:  Mouth/Throat: Oropharynx is clear and moist. No oropharyngeal exudate.  Neck: Neck supple.  Cardiovascular: Regular rhythm and normal heart sounds.   No murmur heard. Mild tachycardia  Pulmonary/Chest: Effort normal and breath sounds normal. No respiratory distress. She has no wheezes. She has no rales.  Frequent coughing  Lymphadenopathy:    She has no cervical adenopathy.  Neurological: She is alert and oriented to person, place, and time.    ED Course  Procedures (including critical care time)  Labs Review Labs Reviewed - No data to  display  Imaging Review No results found.    MDM   1. Bronchitis   2. Asthma exacerbation    She reports improvement after the albuterol nebulizer. Will discharge with prescription for amoxicillin and albuterol inhaler. Return precautions reviewed.    Charm Rings, MD 04/25/15 [id number]

## 2015-04-25 NOTE — Discharge Instructions (Signed)
You have bronchitis. Take amoxicillin twice a day for 7 days. Use the albuterol every 4 hours as needed for cough or wheezing. You should see improvement over the next 2 days. If you develop increased bleeding or crampy pains, [facility]

## 2015-04-25 NOTE — ED Notes (Signed)
Uri symptoms for 4 days.  Patient is pregnant x 5 weeks.  Patient has been using cough drops only
